# Patient Record
Sex: Male | Born: 1960 | Race: Asian | Hispanic: No | Marital: Married | Smoking: Never smoker
Health system: Southern US, Community
[De-identification: ages and names within clinical notes are randomized; demographics above are authoritative.]

---

## 2014-12-08 ENCOUNTER — Ambulatory Visit (INDEPENDENT_AMBULATORY_CARE_PROVIDER_SITE_OTHER): Payer: Self-pay | Admitting: Family Medicine

## 2014-12-08 VITALS — BP 110/70 | HR 60 | Temp 97.9°F | Resp 16 | Ht 61.0 in | Wt 185.0 lb

## 2014-12-08 DIAGNOSIS — Z0000 Encounter for general adult medical examination without abnormal findings: Secondary | ICD-10-CM

## 2014-12-08 NOTE — Progress Notes (Signed)
 @  This chart was scribed for Elvina Sidle, MD by Andrew Au, ED Scribe. This patient was seen in room 11 and the patient's care was started at 11:55 AM.  Patient ID: Malik Moore MRN: [id number], DOB: 08/15/1960 54 y.o. Date of Encounter: 12/08/2014, 11:52 AM  Primary Physician: No primary care provider on file.  Chief Complaint: Physical (CPE)  HPI: 54 y.o. y/o male with history noted below here for DOT physical. 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 He does not speak english and has been in the [place] for 9 years. Doing well. No issues/complaints.  Review of Systems: Consitutional: No fever, chills, fatigue, night sweats, lymphadenopathy, or weight changes. Eyes: No visual changes, eye redness, or discharge. ENT/Mouth: Ears: No otalgia, tinnitus, hearing loss, discharge. Nose: No congestion, rhinorrhea, sinus pain, or epistaxis. Throat: No sore throat, post nasal drip, or teeth pain. Cardiovascular: No CP, palpitations, diaphoresis, DOE, edema, orthopnea, PND. Respiratory: No cough, hemoptysis, SOB, or wheezing. Gastrointestinal: No anorexia, dysphagia, reflux, pain, nausea, vomiting, hematemesis, diarrhea, constipation, BRBPR, or melena. Genitourinary: No dysuria, frequency, urgency, hematuria, incontinence, nocturia, decreased urinary stream, discharge, impotence, or testicular pain/masses. Musculoskeletal: No decreased ROM, myalgias, stiffness, joint swelling, or weakness. Skin: No rash, erythema, lesion changes, pain, warmth, jaundice, or pruritis. Neurological: No headache, dizziness, syncope, seizures, tremors, memory loss, coordination problems, or paresthesias. Psychological: No anxiety, depression, hallucinations, SI/HI. Endocrine: No fatigue, polydipsia, polyphagia, polyuria, or known diabetes. All other systems were reviewed and are otherwise negative.  History reviewed. No pertinent past medical history.   History reviewed. No pertinent past surgical history.  Home Meds:   Prior to Admission medications   Not on File    Allergies: No Known Allergies  History   Social History   Marital Status: Married    Spouse Name: N/A   Number of Children: N/A   Years of Education: N/A   Occupational History   Not on file.   Social History Main Topics   Smoking status: Never Smoker    Smokeless tobacco: Not on file   Alcohol Use: Not on file   Drug Use: Not on file   Sexual Activity: Not on file   Other Topics Concern   Not on file   Social History Narrative   No narrative on file    History reviewed. No pertinent family history.  Physical Exam: Blood pressure 110/70, pulse 60, temperature 97.9 F (36.6 C), temperature source Oral, resp. rate 16, height  (1.549 m), weight 185 lb (83.915 kg), SpO2 98 %.  BP Readings from Last 3 Encounters:  12/08/14 110/70   General: Well developed, well nourished, in no acute distress. HEENT: Normocephalic, atraumatic. Conjunctiva pink, sclera non-icteric. Pupils 2 mm constricting to 1 mm, round, regular, and equally reactive to light and accomodation. EOMI.  Fundi benign   Internal auditory canal clear. TMs with good cone of light and without pathology. Nasal mucosa pink. Nares are without discharge. No sinus tenderness. Oral mucosa pink. Dentition. Pharynx without exudate.    Neck: Supple. Trachea midline. No thyromegaly. Full ROM. No lymphadenopathy. Lungs: Clear to auscultation bilaterally without wheezes, rales, or rhonchi. Breathing is of normal effort and unlabored. Cardiovascular: RRR with S1 S2. No murmurs, rubs, or gallops appreciated. Distal pulses 2+ symmetrically. No carotid or abdominal bruits Abdomen: Soft, non-tender, non-distended with normoactive bowel sounds. No hepatosplenomegaly or masses. No rebound/guarding. No CVA tenderness. Without hernias.    Genitourinary:  uncircumcised male.No hernia Musculoskeletal: Full range of motion and 5/5 strength throughout. Without swelling,  atrophy, tenderness, crepitus, or warmth. Extremities without clubbing, cyanosis, or edema. Calves supple. Skin: Warm and moist without erythema, ecchymosis, wounds, or rash. Neuro: A+Ox3. CN II-XII grossly intact. Moves all extremities spontaneously. Full sensation throughout. Normal gait. DTR 2+ throughout upper and lower extremities. Finger to nose intact. Psych:  Responds to questions appropriately with a normal affect.    Assessment/Plan:  54 y.o. y/o healthy male here for CPE This chart was scribed in my presence and reviewed by me personally.    ICD-9-CM ICD-10-CM   1. Annual physical exam V70.0 Z00.00      Signed, Elvina SidleKurt Lauenstein, MD   Signed, Elvina SidleKurt Lauenstein, MD 12/08/2014 11:52 AM

## 2015-10-06 ENCOUNTER — Other Ambulatory Visit: Payer: Self-pay | Admitting: Sports Medicine

## 2015-10-06 DIAGNOSIS — M545 Low back pain: Secondary | ICD-10-CM

## 2015-10-13 ENCOUNTER — Other Ambulatory Visit: Payer: Self-pay

## 2015-10-24 ENCOUNTER — Ambulatory Visit
Admission: RE | Admit: 2015-10-24 | Discharge: 2015-10-24 | Disposition: A | Discharge status: Home / Self Care | Payer: No Typology Code available for payment source | Source: Ambulatory Visit | Attending: Sports Medicine | Admitting: Sports Medicine

## 2015-10-24 DIAGNOSIS — M545 Low back pain: Secondary | ICD-10-CM

## 2017-02-15 IMAGING — MR MR LUMBAR SPINE W/O CM
4 of 5 series · 18 of 48 positions shown · non-contrast
Comparison: None.

CLINICAL DATA: Low back pain, severe over the past month. Lifting
injury.

EXAM:
MRI LUMBAR SPINE WITHOUT CONTRAST
TECHNIQUE: Multiplanar, multisequence MR imaging of the lumbar spine was
performed. No intravenous contrast was administered.

[Series 6: T2 · sagittal · 4.0mm · 0.78mm/px · 6 of 15 slices shown (1 of 2)]
[im 1/15]
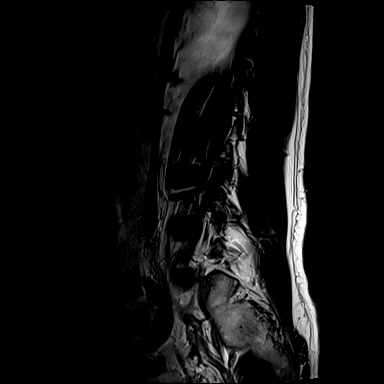
[im 3/15]
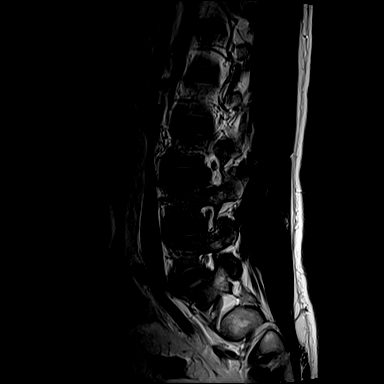
[im 6/15]
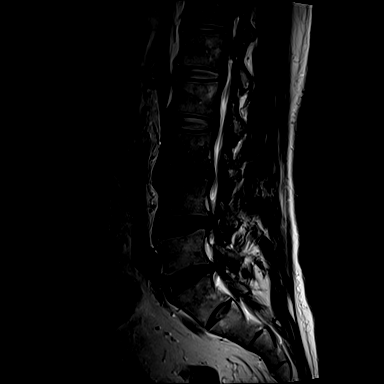
[im 9/15]
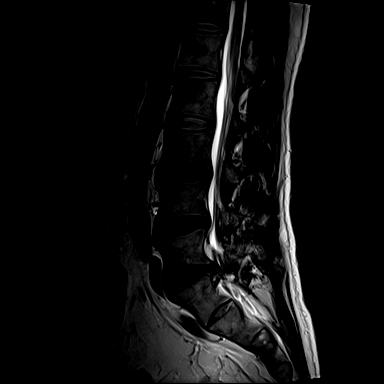
[im 12/15]
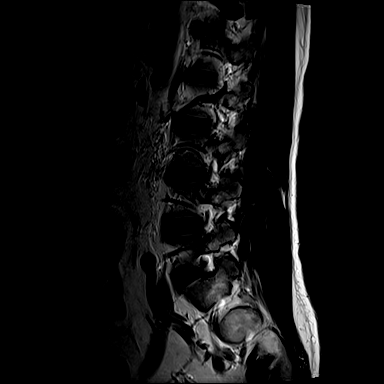
[im 15/15]
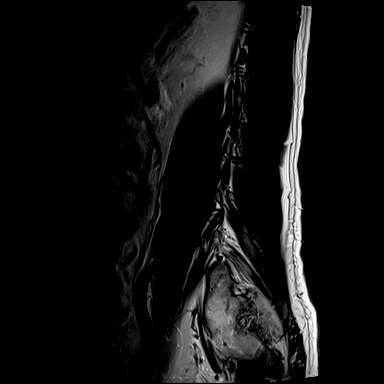

[Series 7: T1 · sagittal · 4.0mm · 0.78mm/px · 3 of 15 slices shown (1 of 2)]
[im 3/15]
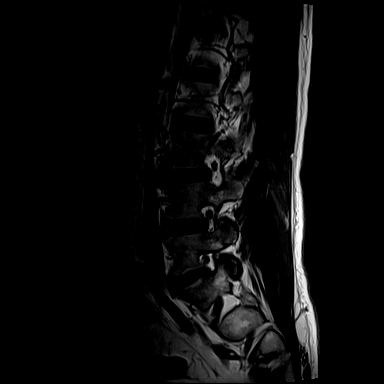
[im 9/15]
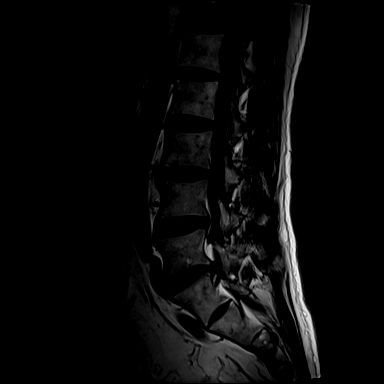
[im 15/15]
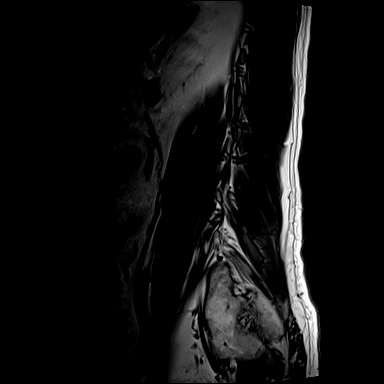

[Series 11: T1 · axial · 4.0mm · 0.28mm/px · z∈[-90,+76]mm · 3 of 41 slices shown (2 of 2)]
[im 6/41]
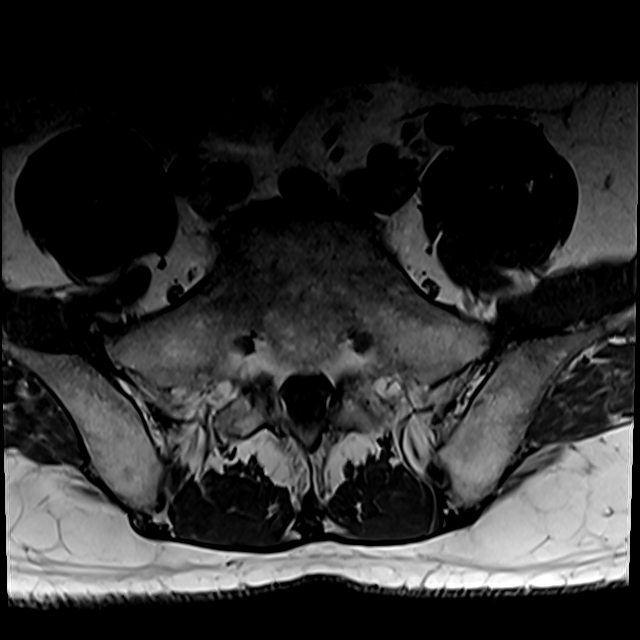
[im 21/41]
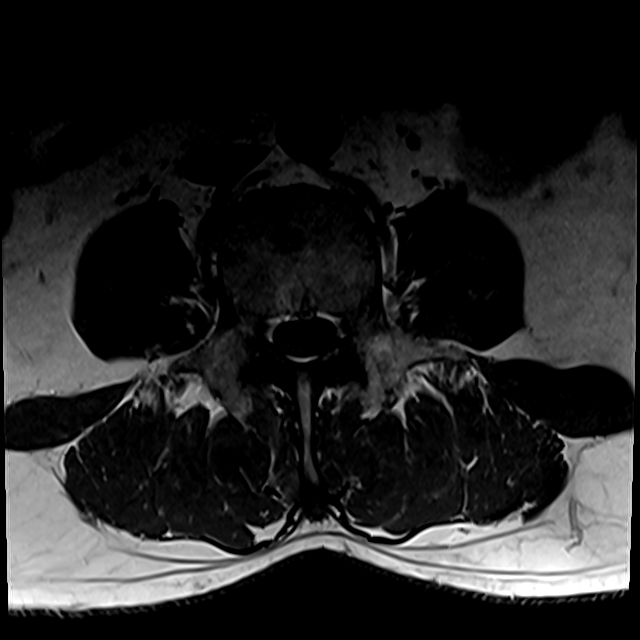
[im 35/41]
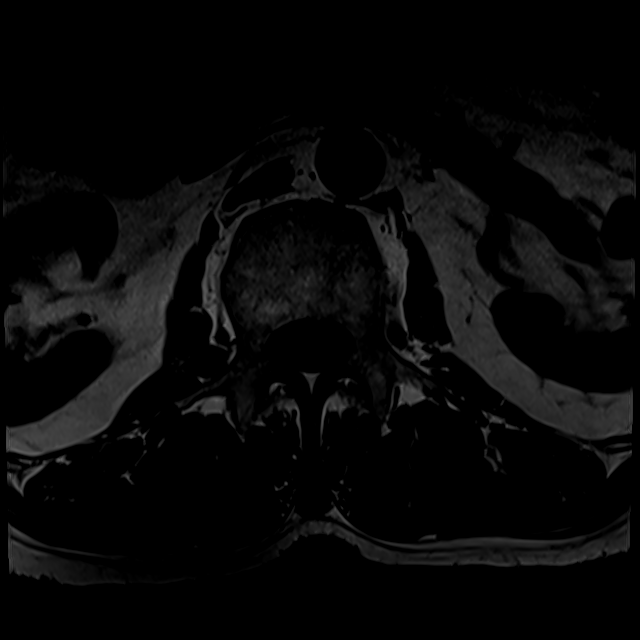

[Series 14: T2 · axial · 4.0mm · 0.28mm/px · z∈[-115,+76]mm · 6 of 41 slices shown (2 of 2)]
[im 1/41]
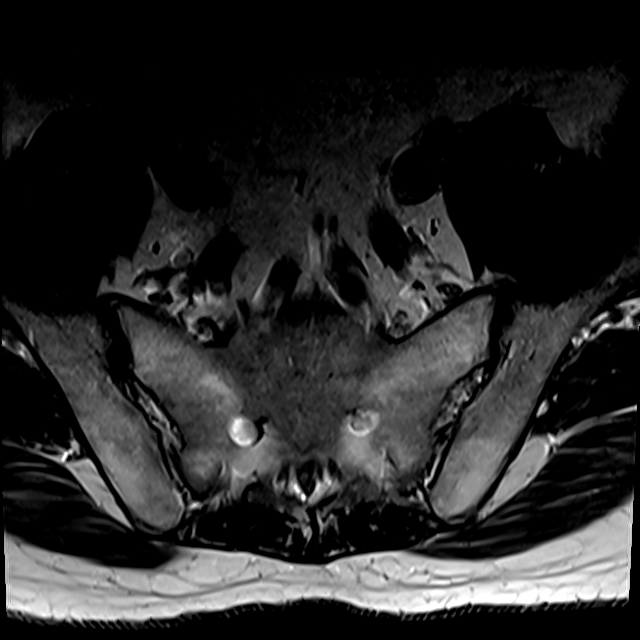
[im 6/41]
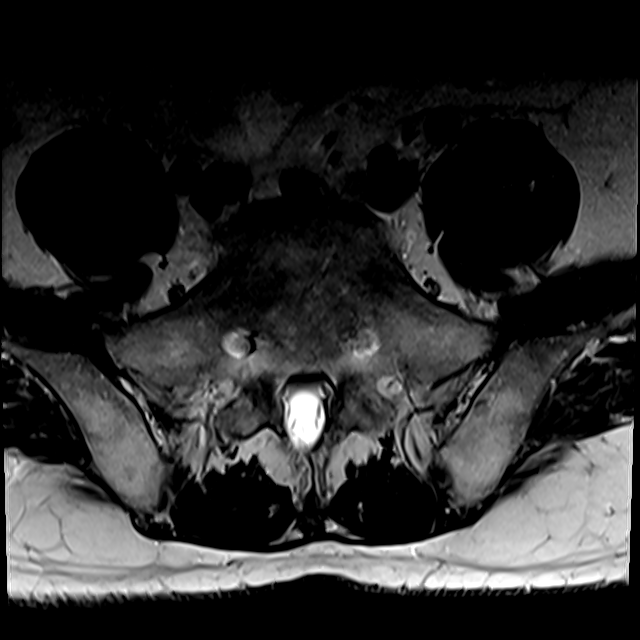
[im 12/41]
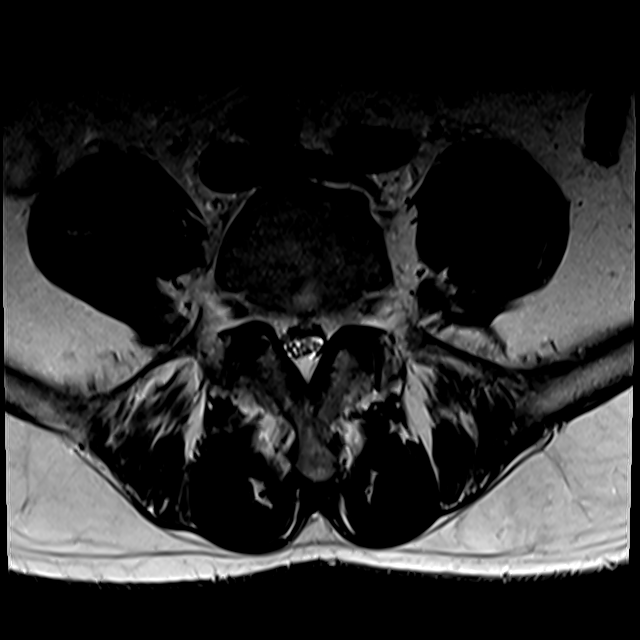
[im 18/41]
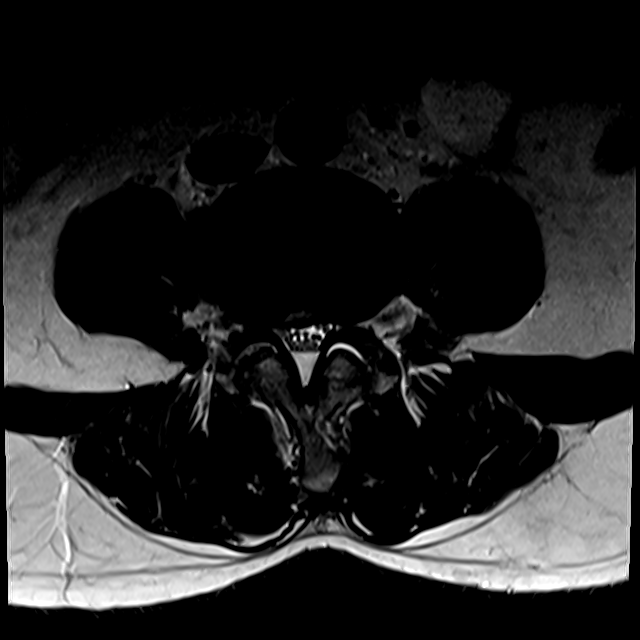
[im 21/41]
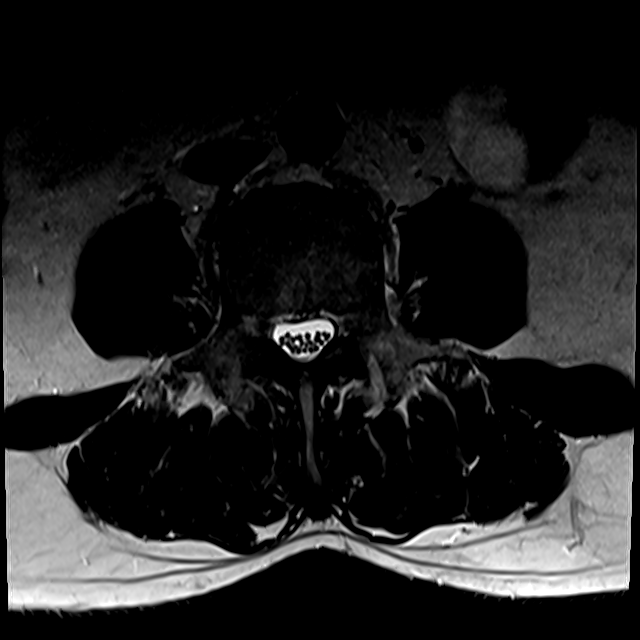
[im 35/41]
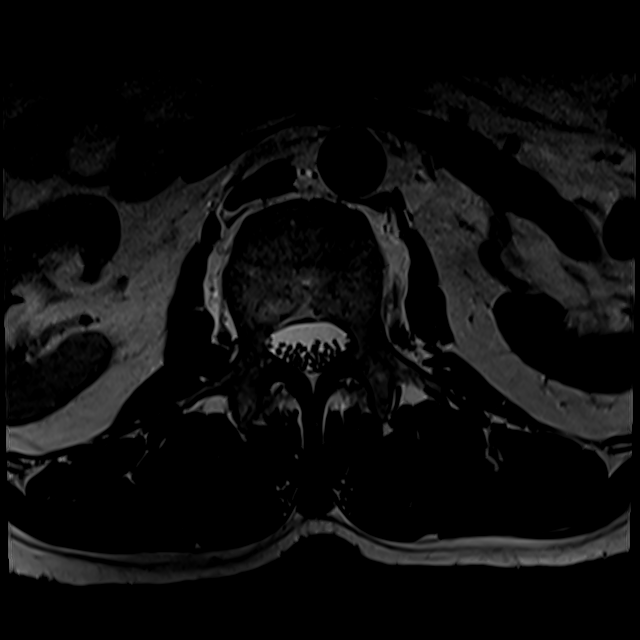

[18 of 48 positions shown; findings below may reference images not displayed]

FINDINGS: Segmentation: Partially segmental S1 with conical morphology, the
lowest fully lumbar type non-rib-bearing vertebra is labeled L5
there is only a rudimentary amount of disc material at the S1- 2
level.

Alignment:  3 mm degenerative retrolisthesis at L4-5 and L5-S1.

Vertebrae: No significant bony lesion. Disc desiccation at all
levels between L 3 and S1.

Conus medullaris: Extends to the upper L2 level and appears normal.

Paraspinal and other soft tissues: Unremarkable

Disc levels:

L1-2:  Unremarkable.

L2-3:  No impingement.  Mild bilateral facet arthropathy.

L3-4: Moderate central narrowing of the thecal sac due to disc bulge
and facet arthropathy. Short pedicles.

L4-5: Moderate central narrowing of the thecal sac with mild
bilateral foraminal stenosis and mild right subarticular lateral
recess stenosis due to disc bulge, right paracentral on lateral
recess disc protrusion, and facet arthropathy.

L5-S1: Prominent central narrowing of the thecal sac with prominent
left and moderate right subarticular lateral recess stenosis and
mild left foraminal stenosis due to disc bulge, left paracentral and
lateral recess disc protrusion, and a disc fragment or disc
extrusion extending caudad in the left lateral recess.

S1- 2:  No impingement, rudimentary disc material.
IMPRESSION: 1. Lumbar spondylosis and degenerative disc disease, causing
prominent impingement at L5-S1 and moderate impingement at L3-4 and
L4-5, as detailed above.
2. Rudimentary disc material noted at the S1-2 level, causing S1 to
be very slightly transitional, but with a conical morphology.
